# Patient Record
Sex: Male | Born: 1965 | Race: White | Hispanic: No | Marital: Married | Smoking: Never smoker
Health system: Southern US, Community
[De-identification: ages and names within clinical notes are randomized; demographics above are authoritative.]

---

## 2006-10-01 ENCOUNTER — Emergency Department (HOSPITAL_COMMUNITY): Admission: EM | Admit: 2006-10-01 | Discharge: 2006-10-01 | Payer: Self-pay | Admitting: Emergency Medicine

## 2006-10-04 ENCOUNTER — Emergency Department (HOSPITAL_COMMUNITY): Admission: EM | Admit: 2006-10-04 | Discharge: 2006-10-04 | Payer: Self-pay | Admitting: Emergency Medicine

## 2011-05-08 ENCOUNTER — Encounter (HOSPITAL_COMMUNITY): Payer: Self-pay | Admitting: *Deleted

## 2011-05-08 ENCOUNTER — Emergency Department (HOSPITAL_COMMUNITY)
Admission: EM | Admit: 2011-05-08 | Discharge: 2011-05-08 | Disposition: A | Discharge status: Home / Self Care | Payer: Worker's Compensation | Attending: Emergency Medicine | Admitting: Emergency Medicine

## 2011-05-08 ENCOUNTER — Emergency Department (HOSPITAL_COMMUNITY): Payer: Worker's Compensation

## 2011-05-08 DIAGNOSIS — S60229A Contusion of unspecified hand, initial encounter: Secondary | ICD-10-CM | POA: Insufficient documentation

## 2011-05-08 DIAGNOSIS — Y9269 Other specified industrial and construction area as the place of occurrence of the external cause: Secondary | ICD-10-CM | POA: Insufficient documentation

## 2011-05-08 DIAGNOSIS — W319XXA Contact with unspecified machinery, initial encounter: Secondary | ICD-10-CM | POA: Insufficient documentation

## 2011-05-08 DIAGNOSIS — S61209A Unspecified open wound of unspecified finger without damage to nail, initial encounter: Secondary | ICD-10-CM | POA: Insufficient documentation

## 2011-05-08 DIAGNOSIS — S60221A Contusion of right hand, initial encounter: Secondary | ICD-10-CM

## 2011-05-08 DIAGNOSIS — S61219A Laceration without foreign body of unspecified finger without damage to nail, initial encounter: Secondary | ICD-10-CM

## 2011-05-08 MED ORDER — IBUPROFEN 800 MG PO TABS
800.0000 mg | ORAL_TABLET | Freq: Once | ORAL | Status: AC
  Administered 2011-05-08: 800 mg via ORAL
  Filled 2011-05-08: qty 1

## 2011-05-08 MED ORDER — HYDROCODONE-ACETAMINOPHEN 5-325 MG PO TABS
ORAL_TABLET | ORAL | Status: DC
Start: 2011-05-08 — End: 2022-07-17

## 2011-05-08 NOTE — ED Provider Notes (Signed)
History     CSN: [id number]  Arrival date & time 05/08/11  4696   First MD Initiated Contact with Patient 05/08/11 1952      Chief Complaint  Patient presents with  . Hand Injury    (Consider location/radiation/quality/duration/timing/severity/associated sxs/prior treatment) HPI Comments: R hand dominant.  dT UTD.  Patient is a 46 y.o. male presenting with hand injury. The history is provided by the patient. No language interpreter was used.  Hand Injury  The incident occurred 1 to 2 hours ago. The incident occurred at work. Injury mechanism: crush-type trauma to R hand in machine at work. Pain location: R thumb and 3rd finger. The quality of the pain is described as throbbing. The pain is at a severity of 6/10. The pain has been constant since the incident. He reports no foreign bodies present. The symptoms are aggravated by movement and palpation. He has tried nothing for the symptoms.    History reviewed. No pertinent past medical history.  History reviewed. No pertinent past surgical history.  History reviewed. No pertinent family history.  History  Substance Use Topics  . Smoking status: Never Smoker   . Smokeless tobacco: Not on file  . Alcohol Use: No      Review of Systems  Musculoskeletal:       Hand injury  Skin: Positive for wound.  All other systems reviewed and are negative.    Allergies  Review of patient's allergies indicates no known allergies.  Home Medications   Current Outpatient Rx  Name Route Sig Dispense Refill  . HYDROCODONE-ACETAMINOPHEN 5-325 MG PO TABS  One tab po q 4-6 hrs prn pain 20 tablet 0    BP 129/106  Pulse 115  Temp(Src) 98.8 F (37.1 C) (Oral)  Resp 16  Ht 5\' 5"  (1.651 m)  Wt 240 lb (108.863 kg)  BMI 39.94 kg/m2  SpO2 100%  Physical Exam  Nursing note and vitals reviewed. Constitutional: He is oriented to person, place, and time. He appears well-developed and well-nourished.  HENT:  Head: Normocephalic and  atraumatic.  Eyes: EOM are normal.  Neck: Normal range of motion.  Cardiovascular: Normal rate, regular rhythm, normal heart sounds and intact distal pulses.   Pulmonary/Chest: Effort normal and breath sounds normal. No respiratory distress.  Abdominal: Soft. He exhibits no distension. There is no tenderness.  Musculoskeletal: He exhibits tenderness.       Right hand: He exhibits decreased range of motion, tenderness, bony tenderness, laceration and swelling. He exhibits no deformity. normal sensation noted.       Hands: Neurological: He is alert and oriented to person, place, and time.  Skin: Skin is warm and dry.  Psychiatric: He has a normal mood and affect. Judgment normal.    ED Course  Procedures (including critical care time)  Labs Reviewed - No data to display Dg Hand Complete Right  05/08/2011  *RADIOLOGY REPORT*  Clinical Data: Pain and swelling post injury  RIGHT HAND - COMPLETE 3+ VIEW  Comparison: None.  Findings: Three views of the right hand submitted.  No acute fracture or subluxation.  Soft tissue swelling noted especially in palmar aspect.  IMPRESSION: No acute fracture or subluxation.  Soft tissue swelling.  Original Report Authenticated By: Natasha Mead, M.D.     1. Contusion of right hand   2. Laceration of finger       MDM  Ice Thumb spica Ibuprofen rx-hydrocodone, 20 F/u with dr. Hilda Lias.        Jacinda Kanady Judie Petit  [place], [place] 05/08/11 2017  Worthy Rancher, PA 05/08/11 2022

## 2011-05-08 NOTE — ED Notes (Signed)
Right hand got squeezed by a machine at work, works for [employer], Counsellor noted right ring finger, hand had got caught in machinery

## 2011-05-08 NOTE — ED Notes (Signed)
Pt D/C to home with friend.

## 2011-05-08 NOTE — Discharge Instructions (Signed)
Contusion A contusion is a deep bruise. Contusions happen when an injury causes bleeding under the skin. Signs of bruising include pain, puffiness (swelling), and discolored skin. The contusion may turn blue, purple, or yellow. HOME CARE   Put ice on the injured area.   Put ice in a plastic bag.   Place a towel between your skin and the bag.   Leave the ice on for 15 to 20 minutes, 3 to 4 times a day.   Only take medicine as told by your doctor.   Rest the injured area.   If possible, raise (elevate) the injured area to lessen puffiness.  GET HELP RIGHT AWAY IF:   You have more bruising or puffiness.   You have pain that is getting worse.   Your puffiness or pain is not helped by medicine.  MAKE SURE YOU:   Understand these instructions.   Will watch your condition.   Will get help right away if you are not doing well or get worse.  Document Released: 07/18/2007 Document Revised: 01/18/2011 Document Reviewed: 12/04/2010 [facility] Patient Information 2012 [place], [place].Wound Care Wound care helps prevent pain and infection.  You may need a tetanus shot if:  You cannot remember when you had your last tetanus shot.   You have never had a tetanus shot.   The injury broke your skin.  If you need a tetanus shot and you choose not to have one, you may get tetanus. Sickness from tetanus can be serious. HOME CARE   Only take medicine as told by your doctor.   Clean the wound daily with mild soap and water.   Change any bandages (dressings) as told by your doctor.   Put medicated cream and a bandage on the wound as told by your doctor.   Change the bandage if it gets wet, dirty, or starts to smell.   Take showers. Do not take baths, swim, or do anything that puts your wound under water.   Rest and raise (elevate) the wound until the pain and puffiness (swelling) are better.   Keep all doctor visits as told.  GET HELP RIGHT AWAY IF:   Yellowish-white fluid (pus)  comes from the wound.   Medicine does not lessen your pain.   There is a red streak going away from the wound.   You cannot move your finger or toe.   You have a fever.  MAKE SURE YOU:   Understand these instructions.   Will watch your condition.   Will get help right away if you are not doing well or get worse.  Document Released: 11/08/2007 Document Revised: 01/18/2011 Document Reviewed: 06/04/2010 [facility] Patient Information 2012 [place], [place].Cryotherapy Cryotherapy means treatment with cold. Ice or gel packs can be used to reduce both pain and swelling. Ice is the most helpful within the first 24 to 48 hours after an injury or flareup from overusing a muscle or joint. Sprains, strains, spasms, burning pain, shooting pain, and aches can all be eased with ice. Ice can also be used when recovering from surgery. Ice is effective, has very few side effects, and is safe for most people to use. PRECAUTIONS  Ice is not a safe treatment option for people with:  Raynaud's phenomenon. This is a condition affecting small blood vessels in the extremities. Exposure to cold may cause your problems to return.   Cold hypersensitivity. There are many forms of cold hypersensitivity, including:   Cold urticaria. Red, itchy hives appear on the skin  when the tissues begin to warm after being iced.   Cold erythema. This is a red, itchy rash caused by exposure to cold.   Cold hemoglobinuria. Red blood cells break down when the tissues begin to warm after being iced. The hemoglobin that carry oxygen are passed into the urine because they cannot combine with blood proteins fast enough.   Numbness or altered sensitivity in the area being iced.  If you have any of the following conditions, do not use ice until you have discussed cryotherapy with your caregiver:  Heart conditions, such as arrhythmia, angina, or chronic heart disease.   High blood pressure.   Healing wounds or open skin in the area  being iced.   Current infections.   Rheumatoid arthritis.   Poor circulation.   Diabetes.  Ice slows the blood flow in the region it is applied. This is beneficial when trying to stop inflamed tissues from spreading irritating chemicals to surrounding tissues. However, if you expose your skin to cold temperatures for too long or without the proper protection, you can damage your skin or nerves. Watch for signs of skin damage due to cold. HOME CARE INSTRUCTIONS Follow these tips to use ice and cold packs safely.  Place a dry or damp towel between the ice and skin. A damp towel will cool the skin more quickly, so you may need to shorten the time that the ice is used.   For a more rapid response, add gentle compression to the ice.   Ice for no more than 10 to 20 minutes at a time. The bonier the area you are icing, the less time it will take to get the benefits of ice.   Check your skin after 5 minutes to make sure there are no signs of a poor response to cold or skin damage.   Rest 20 minutes or more in between uses.   Once your skin is numb, you can end your treatment. You can test numbness by very lightly touching your skin. The touch should be so light that you do not see the skin dimple from the pressure of your fingertip. When using ice, most people will feel these normal sensations in this order: cold, burning, aching, and numbness.   Do not use ice on someone who cannot communicate their responses to pain, such as small children or people with dementia.  HOW TO MAKE AN ICE PACK Ice packs are the most common way to use ice therapy. Other methods include ice massage, ice baths, and cryo-sprays. Muscle creams that cause a cold, tingly feeling do not offer the same benefits that ice offers and should not be used as a substitute unless recommended by your caregiver. To make an ice pack, do one of the following:  Place crushed ice or a bag of frozen vegetables in a sealable plastic bag.  Squeeze out the excess air. Place this bag inside another plastic bag. Slide the bag into a pillowcase or place a damp towel between your skin and the bag.   Mix 3 parts water with 1 part rubbing alcohol. Freeze the mixture in a sealable plastic bag. When you remove the mixture from the freezer, it will be slushy. Squeeze out the excess air. Place this bag inside another plastic bag. Slide the bag into a pillowcase or place a damp towel between your skin and the bag.  SEEK MEDICAL CARE IF:  You develop white spots on your skin. This may give the skin a blotchy (  mottled) appearance.   Your skin turns blue or pale.   Your skin becomes waxy or hard.   Your swelling gets worse.  MAKE SURE YOU:   Understand these instructions.   Will watch your condition.   Will get help right away if you are not doing well or get worse.  Document Released: 09/25/2010 Document Revised: 01/18/2011 Document Reviewed: 09/25/2010 [facility] Patient Information 2012 [place], [place].   Wear the splint for comfort and stability.  Apply ice several times daily and elevate hand as much as possible.  Wash the wound twice daily with soap and water then apply antibiotic ointment.  Take ibuprofen up to 800 mg every 8 hrs with food.  Take the pain medicine as directed.  Call dr. Hilda Lias and make an appt for further evaluation.

## 2011-05-12 NOTE — ED Provider Notes (Signed)
Medical screening examination/treatment/procedure(s) were performed by non-physician practitioner and as supervising physician I was immediately available for consultation/collaboration.  Storm Dulski L Emanuele Mcwhirter, MD 05/12/11 0938 

## 2013-08-12 IMAGING — CR DG HAND COMPLETE 3+V*R*
3 series · 3 of 3 positions shown · non-contrast
Comparison: None.

CLINICAL DATA: Pain and swelling post injury

RIGHT HAND - COMPLETE 3+ VIEW

[view not recorded (1 of 3)]
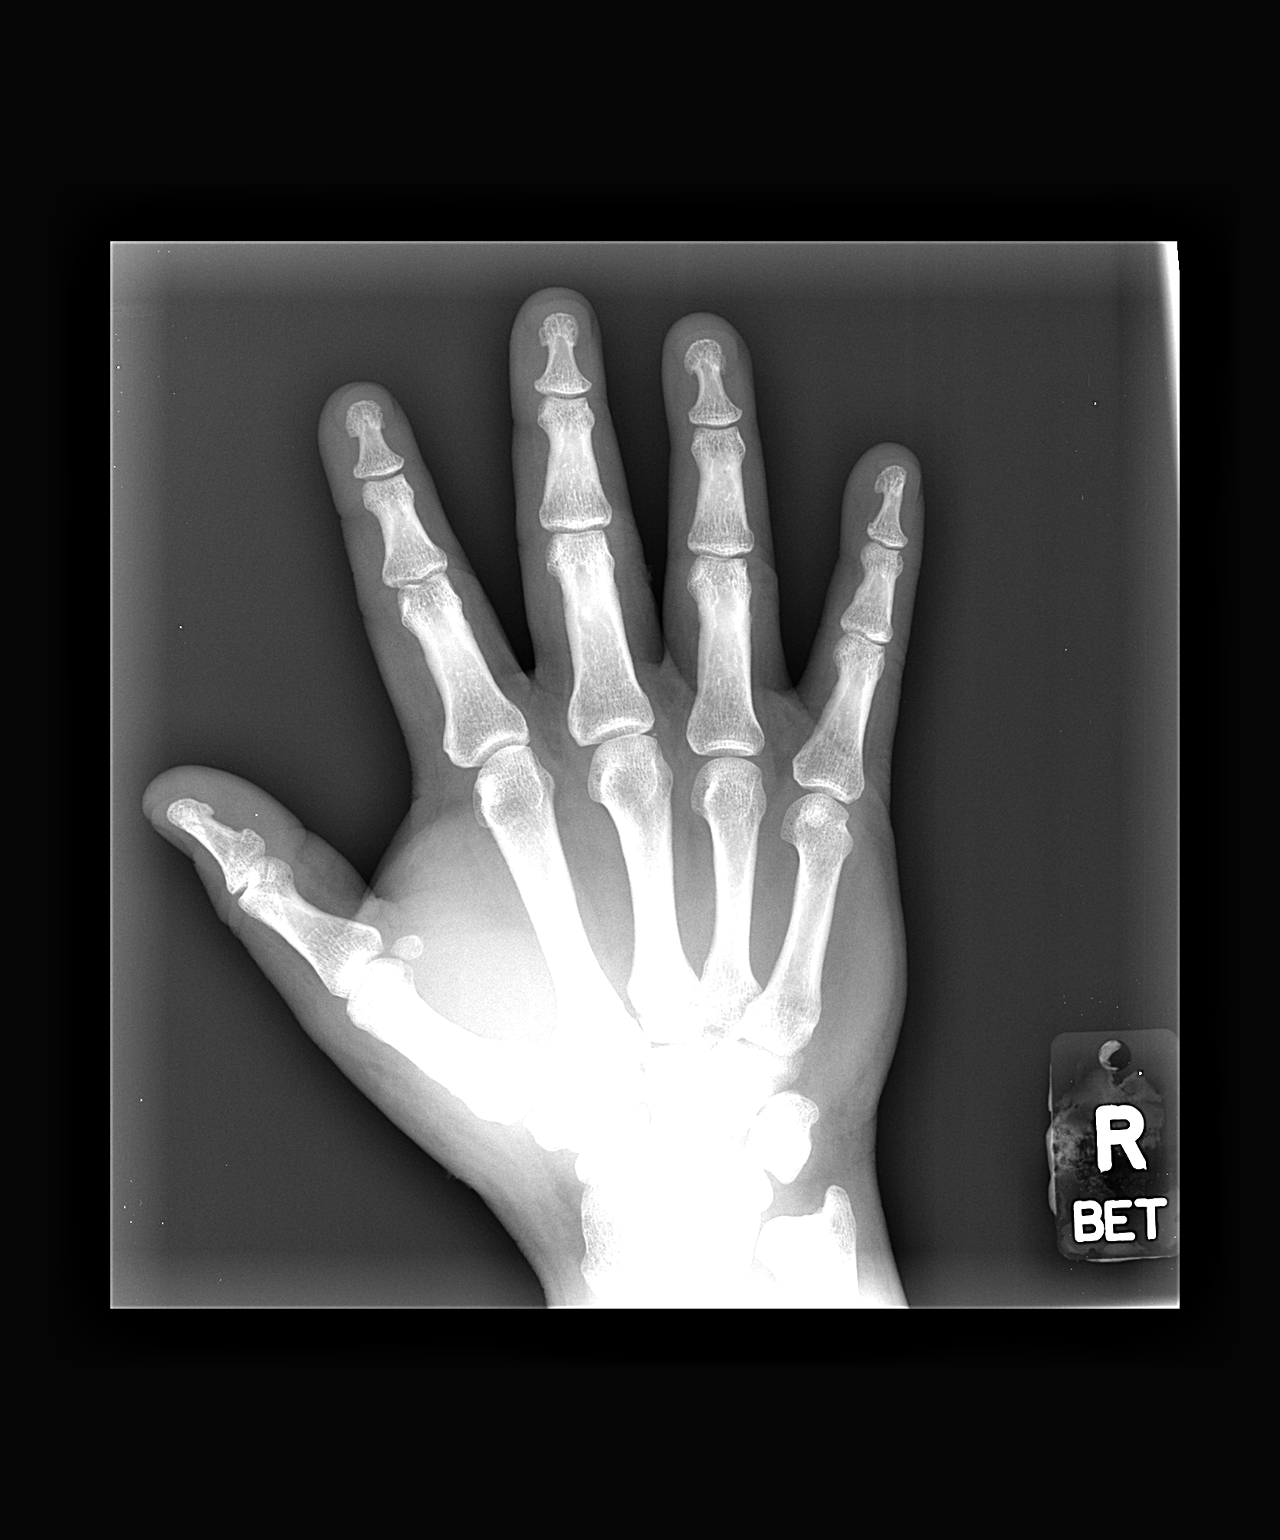

[view not recorded (2 of 3)]
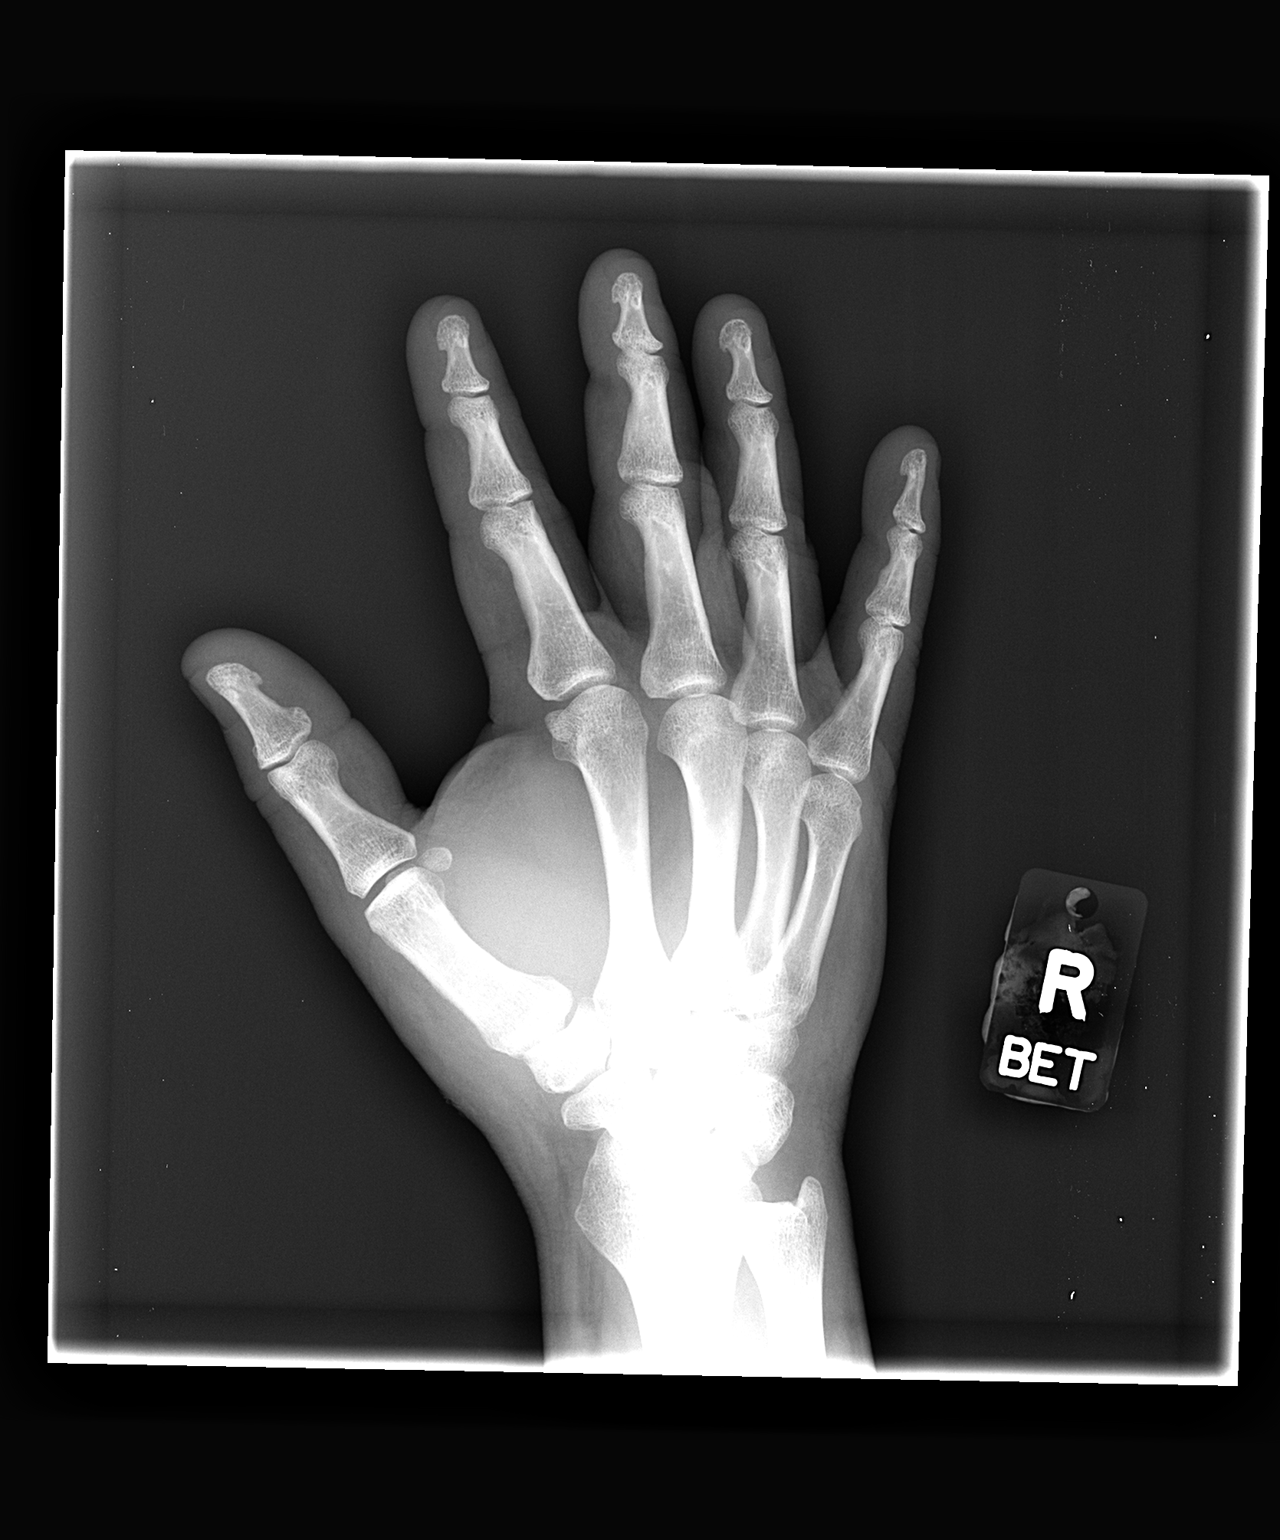

[view not recorded (3 of 3)]
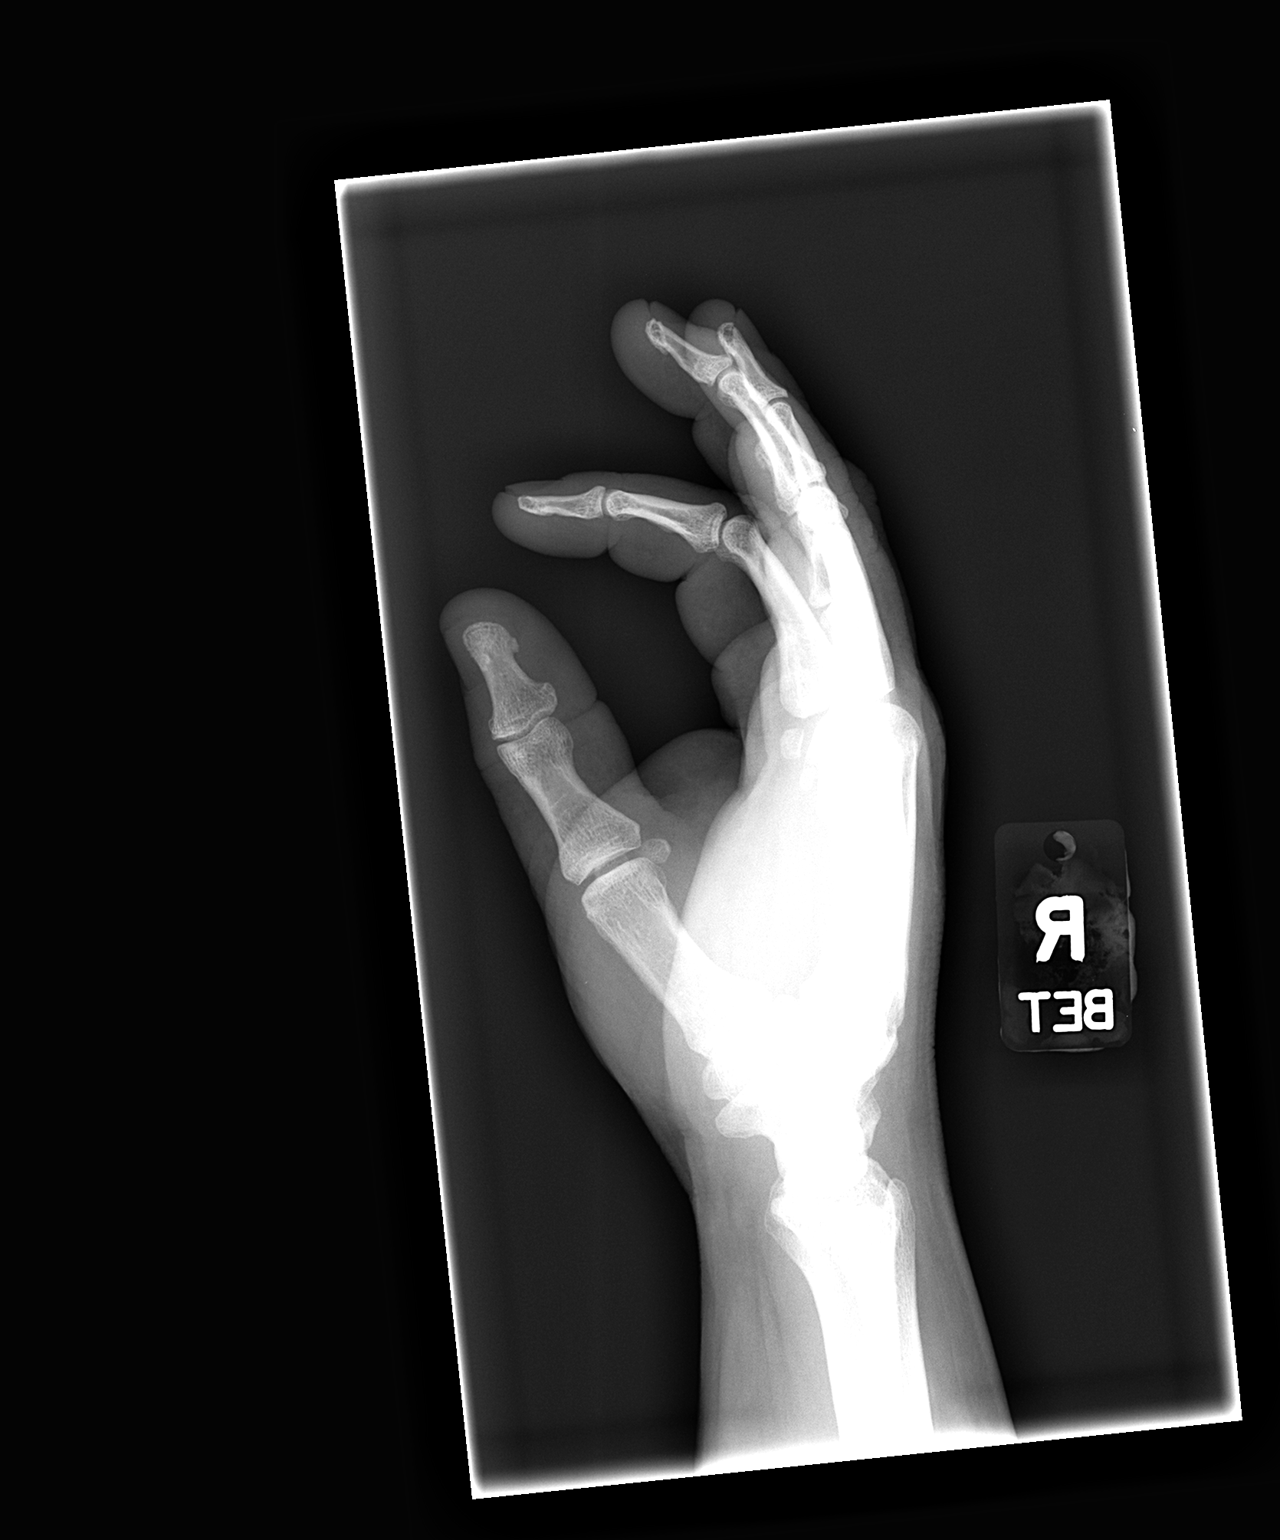

[3 of 3 positions shown; findings below may reference images not displayed]

FINDINGS: Three views of the right hand submitted.  No acute
fracture or subluxation.  Soft tissue swelling noted especially in
palmar aspect.
IMPRESSION: No acute fracture or subluxation.  Soft tissue swelling.

## 2022-07-15 ENCOUNTER — Emergency Department (HOSPITAL_COMMUNITY)
Admission: EM | Admit: 2022-07-15 | Discharge: 2022-07-15 | Disposition: A | Discharge status: Home / Self Care | Payer: BC Managed Care – PPO | Attending: Emergency Medicine | Admitting: Emergency Medicine

## 2022-07-15 ENCOUNTER — Emergency Department (HOSPITAL_COMMUNITY): Payer: BC Managed Care – PPO

## 2022-07-15 ENCOUNTER — Other Ambulatory Visit: Payer: Self-pay

## 2022-07-15 ENCOUNTER — Encounter (HOSPITAL_COMMUNITY): Payer: Self-pay | Admitting: Emergency Medicine

## 2022-07-15 DIAGNOSIS — K5792 Diverticulitis of intestine, part unspecified, without perforation or abscess without bleeding: Secondary | ICD-10-CM

## 2022-07-15 DIAGNOSIS — K5732 Diverticulitis of large intestine without perforation or abscess without bleeding: Secondary | ICD-10-CM | POA: Insufficient documentation

## 2022-07-15 DIAGNOSIS — R1032 Left lower quadrant pain: Secondary | ICD-10-CM

## 2022-07-15 DIAGNOSIS — K5900 Constipation, unspecified: Secondary | ICD-10-CM | POA: Insufficient documentation

## 2022-07-15 LAB — URINALYSIS, ROUTINE W REFLEX MICROSCOPIC
Bilirubin Urine: NEGATIVE
Glucose, UA: NEGATIVE mg/dL
Hgb urine dipstick: NEGATIVE
Ketones, ur: NEGATIVE mg/dL
Leukocytes,Ua: NEGATIVE
Nitrite: NEGATIVE
Protein, ur: NEGATIVE mg/dL
Specific Gravity, Urine: 1.017 (ref 1.005–1.030)
pH: 7 (ref 5.0–8.0)

## 2022-07-15 LAB — COMPREHENSIVE METABOLIC PANEL
ALT: 29 U/L (ref 0–44)
AST: 25 U/L (ref 15–41)
Albumin: 4 g/dL (ref 3.5–5.0)
Alkaline Phosphatase: 60 U/L (ref 38–126)
Anion gap: 7 (ref 5–15)
BUN: 29 mg/dL — ABNORMAL HIGH (ref 6–20)
CO2: 24 mmol/L (ref 22–32)
Calcium: 8.4 mg/dL — ABNORMAL LOW (ref 8.9–10.3)
Chloride: 103 mmol/L (ref 98–111)
Creatinine, Ser: 1.38 mg/dL — ABNORMAL HIGH (ref 0.61–1.24)
GFR, Estimated: >60 mL/min (ref >60)
Glucose, Bld: 104 mg/dL — ABNORMAL HIGH (ref 70–99)
Potassium: 4.1 mmol/L (ref 3.5–5.1)
Sodium: 134 mmol/L — ABNORMAL LOW (ref 135–145)
Total Bilirubin: 0.9 mg/dL (ref 0.3–1.2)
Total Protein: 7 g/dL (ref 6.5–8.1)

## 2022-07-15 LAB — CBC
HCT: 41.3 % (ref 39.0–52.0)
Hemoglobin: 14.2 g/dL (ref 13.0–17.0)
MCH: 29.9 pg (ref 26.0–34.0)
MCHC: 34.4 g/dL (ref 30.0–36.0)
MCV: 86.9 fL (ref 80.0–100.0)
Platelets: 217 10*3/uL (ref 150–400)
RBC: 4.75 MIL/uL (ref 4.22–5.81)
RDW: 12.7 % (ref 11.5–15.5)
WBC: 7.1 10*3/uL (ref 4.0–10.5)
nRBC: 0 % (ref 0.0–0.2)

## 2022-07-15 LAB — LIPASE, BLOOD: Lipase: 42 U/L (ref 11–51)

## 2022-07-15 MED ORDER — AMOXICILLIN-POT CLAVULANATE 875-125 MG PO TABS
1.0000 | ORAL_TABLET | Freq: Once | ORAL | Status: AC
  Administered 2022-07-15: 1 via ORAL
  Filled 2022-07-15: qty 1

## 2022-07-15 MED ORDER — AMOXICILLIN-POT CLAVULANATE 875-125 MG PO TABS: 1.0000 | ORAL_TABLET | Freq: Two times a day (BID) | ORAL | 0 refills | Status: AC

## 2022-07-15 NOTE — Discharge Instructions (Addendum)
You are seen in the emergency room for abdominal pain, constipation. As discussed, x-ray showing signs of constipation.  Lab workup is normal.  We would like to get a CT scan to get clarity, but the CT is not available.  We did give you the option of getting CT done at [facility], but you are comfortable with the wait and watch approach.  Start taking the antibiotics that are prescribed as we suspect that you might have diverticulitis. Take mag citrate tonight before you go to bed. Ensure you are hydrating well. Please get a primary care doctor and contact the GI doctor to get colonoscopy done -as sometimes constipation could present because of a colonic mass or tumor.  Return to the emergency room immediately if you start having worsening abdominal pain, fevers, chills, sweats or you stop passing gas and have severe nausea and vomiting.

## 2022-07-15 NOTE — ED Provider Notes (Signed)
[place] EMERGENCY DEPARTMENT AT [facility] Provider Note   CSN: [id number] Arrival date & time: 07/15/22  1532     History  Chief Complaint  Patient presents with   Abdominal Pain   Constipation    Jeffery Lopez is a 57 y.o. male.  HPI    57 year old male comes in with chief complaint of constipation, lower quadrant abdominal pain. Patient has no medical history, however he does not see a primary care doctor and does not have a primary care doctor.  Patient is complaining of left lower quadrant abdominal and back pain that started on Wednesday, 3 days ago.  2 days ago he was feeling well, however yesterday the pain started returning and today it is worse.  He did go to urgent care yesterday and they informed him that he likely has constipation.  They gave him stool softener and mag citrate and informed him to come to the ER if he does not get better.  Patient has a history of kidney stone in the past, this pain feels different.  He denies any urinary symptoms such as burning with urination, urinary frequency or blood in the urine.  Patient did have a bowel movement today, but it was not normal amount.  He is currently passing flatus.  He has no abdominal surgical history.  He has taken over-the-counter medications.  Patient states that occasionally the pain will come and is severe.  Review of system is negative for any nausea, vomiting, fevers, chills, bloody stools.  Patient states that over the last 6 months in order to lose weight he has been on heavy salad diet.  He has never had a colonoscopy.  Home Medications Prior to Admission medications   Medication Sig Start Date End Date Taking? Authorizing Provider  amoxicillin-clavulanate (AUGMENTIN) 875-125 MG tablet Take 1 tablet by mouth every 12 (twelve) hours. 07/15/22  Yes Weston Kallman, MD  docusate sodium (COLACE) 100 MG capsule Take 100 mg by mouth 2 (two) times daily. For 3 day 07/14/22  Yes [provider]  ibuprofen (ADVIL) 200 MG tablet Take 400 mg by mouth every 6 (six) hours as needed for moderate pain or headache.   Yes [provider]  magnesium citrate SOLN Take 1 Bottle by mouth once.   Yes [provider]  HYDROcodone-acetaminophen (NORCO) 5-325 MG per tablet One tab po q 4-6 hrs prn pain Patient not taking: Reported on 07/15/2022 05/08/11   Worthy Rancher, PA-C      Allergies    Patient has no known allergies.    Review of Systems   Review of Systems  All other systems reviewed and are negative.   Physical Exam Updated Vital Signs BP (!) 130/90 (BP Location: Left Arm)   Pulse 67   Temp 98.5 F (36.9 C) (Oral)   Resp 20   Ht 5\' 10"  (1.778 m)   Wt 103.9 kg   SpO2 97%   BMI 32.86 kg/m  Physical Exam Vitals and nursing note reviewed.  Constitutional:      Appearance: He is well-developed.  HENT:     Head: Atraumatic.  Cardiovascular:     Rate and Rhythm: Normal rate.  Pulmonary:     Effort: Pulmonary effort is normal.  Abdominal:     Tenderness: There is abdominal tenderness in the left lower quadrant. There is no right CVA tenderness, left CVA tenderness, guarding or rebound.  Musculoskeletal:     Cervical back: Neck supple.  Skin:  General: Skin is warm.  Neurological:     Mental Status: He is alert and oriented to person, place, and time.     ED Results / Procedures / Treatments   Labs (all labs ordered are listed, but only abnormal results are displayed) Labs Reviewed  COMPREHENSIVE METABOLIC PANEL - Abnormal; Notable for the following components:      Result Value   Sodium 134 (*)    Glucose, Bld 104 (*)    BUN 29 (*)    Creatinine, Ser 1.38 (*)    Calcium 8.4 (*)    All other components within normal limits  URINALYSIS, ROUTINE W REFLEX MICROSCOPIC - Abnormal; Notable for the following components:   APPearance HAZY (*)    All other components within normal limits  LIPASE, BLOOD  CBC    EKG None  Radiology DG  Abdomen Acute W/Chest  Result Date: 07/15/2022 CLINICAL DATA:  Possible bowel obstruction EXAM: DG ABDOMEN ACUTE WITH 1 VIEW CHEST COMPARISON:  None Available. FINDINGS: Bowel gas pattern is nonspecific. There is no definite pneumoperitoneum. No abnormal masses or calcifications are seen. Lung fields are clear of any infiltrates. There is no pleural effusion or pneumothorax. IMPRESSION: Nonspecific bowel gas pattern. There are no focal pulmonary infiltrates. Electronically Signed   By: Ernie Avena M.D.   On: 07/15/2022 18:02    Procedures Procedures    Medications Ordered in ED Medications  amoxicillin-clavulanate (AUGMENTIN) 875-125 MG per tablet 1 tablet (1 tablet Oral Given 07/15/22 1922)    ED Course/ Medical Decision Making/ A&P                             Medical Decision Making Amount and/or Complexity of Data Reviewed Labs: ordered. Radiology: ordered.  Risk Prescription drug management.   This patient presents to the ED with chief complaint(s) of abdominal pain with pertinent past medical history of no PCP, no colonoscopy and urgent care visit yesterday where he was started on stool softeners and laxative.The complaint involves an extensive differential diagnosis and also carries with it a high risk of complications and morbidity.    The differential diagnosis includes : Acute diverticulitis without complication, ileus, small bowel obstruction, severe constipation, kidney stones, colonic mass/tumor.   Patient's exam is overall reassuring.  No evidence of peritonitis.  He has no other systemic symptoms either which is reassuring.   The initial plan is to get basic labs, urine analysis and acute abdominal series.  Since he has never had a diagnosis of diverticulosis and there is no imaging or colonoscopy that can inform me that patient has known diverticulosis -I am a bit hesitant in giving patient the diagnosis of diverticulitis without getting a CT scan.  However there  is no CT scan available today.  I discussed with the patient that ideal best neck step is getting a CT scan, however it is not available today.  I informed him that this pain could be diverticulitis, that exam is not consistent with surgical pathology or complication from diverticulitis -but in this case my best neck step is getting a CT scan.  I discussed with him that we can transfer him to [facility] or his wife can drive him to [facility] and get a CT scan today, and we can facilitate that.  Patient however is comfortable going home, and returning to the ER if his symptoms get worse.  I will reassess him after the acute abdominal series.  Reassessment: I have independently interpreted acute abdominal series.  There is evidence of obstruction, but there are no air-fluid levels.  No clinical concerns for small bowel obstruction.  Patient's repeat abdominal exam is reassuring.  He indicates that he has not had any severe pain while awaiting results.  We had a repeat conversation about CT scan.  We offered him CT again, but he feels comfortable going home and will return to the ER if he is getting worse. I discussed with him that if he decides to return to the ER on his own without calling ambulance, then he should consider going to [facility] or [facility] in [place].  If his symptoms are getting worse tomorrow, he can call the ER ahead of time to see if the CT is functional.   Final Clinical Impression(s) / ED Diagnoses Final diagnoses:  LLQ abdominal pain  Diverticulitis  Constipation, unspecified constipation type    Rx / DC Orders ED Discharge Orders          Ordered    amoxicillin-clavulanate (AUGMENTIN) 875-125 MG tablet  Every 12 hours        07/15/22 1953              Derwood Kaplan, MD 07/15/22 2005

## 2022-07-15 NOTE — ED Triage Notes (Addendum)
Pt c/o severe left flank and LLQ abdominal pain since Wednesday, seen yesterday at [facility] and dx severe constipation. Pt reports dx SBO but paperwork does not indicate same. Severe 8/10 pain despite OTC meds, LBM was this morning but was not normal per pt

## 2022-07-15 NOTE — ED Notes (Signed)
Pt states he is unable to urinate at this time.

## 2022-07-17 ENCOUNTER — Other Ambulatory Visit: Payer: Self-pay

## 2022-07-17 ENCOUNTER — Emergency Department (HOSPITAL_COMMUNITY)
Admission: EM | Admit: 2022-07-17 | Discharge: 2022-07-17 | Disposition: A | Discharge status: Home / Self Care | Payer: BC Managed Care – PPO | Attending: Emergency Medicine | Admitting: Emergency Medicine

## 2022-07-17 ENCOUNTER — Emergency Department (HOSPITAL_COMMUNITY): Payer: BC Managed Care – PPO

## 2022-07-17 ENCOUNTER — Encounter (HOSPITAL_COMMUNITY): Payer: Self-pay | Admitting: *Deleted

## 2022-07-17 DIAGNOSIS — R1032 Left lower quadrant pain: Secondary | ICD-10-CM | POA: Diagnosis present

## 2022-07-17 DIAGNOSIS — N200 Calculus of kidney: Secondary | ICD-10-CM | POA: Diagnosis not present

## 2022-07-17 LAB — COMPREHENSIVE METABOLIC PANEL
ALT: 28 U/L (ref 0–44)
AST: 21 U/L (ref 15–41)
Albumin: 3.8 g/dL (ref 3.5–5.0)
Alkaline Phosphatase: 66 U/L (ref 38–126)
Anion gap: 8 (ref 5–15)
BUN: 23 mg/dL — ABNORMAL HIGH (ref 6–20)
CO2: 26 mmol/L (ref 22–32)
Calcium: 8.5 mg/dL — ABNORMAL LOW (ref 8.9–10.3)
Chloride: 98 mmol/L (ref 98–111)
Creatinine, Ser: 1.54 mg/dL — ABNORMAL HIGH (ref 0.61–1.24)
GFR, Estimated: 53 mL/min — ABNORMAL LOW (ref >60)
Glucose, Bld: 108 mg/dL — ABNORMAL HIGH (ref 70–99)
Potassium: 4.4 mmol/L (ref 3.5–5.1)
Sodium: 132 mmol/L — ABNORMAL LOW (ref 135–145)
Total Bilirubin: 0.9 mg/dL (ref 0.3–1.2)
Total Protein: 6.8 g/dL (ref 6.5–8.1)

## 2022-07-17 LAB — CBC WITH DIFFERENTIAL/PLATELET
Abs Immature Granulocytes: 0.02 10*3/uL (ref 0.00–0.07)
Basophils Absolute: 0 10*3/uL (ref 0.0–0.1)
Basophils Relative: 0 %
Eosinophils Absolute: 0.1 10*3/uL (ref 0.0–0.5)
Eosinophils Relative: 1 %
HCT: 41.3 % (ref 39.0–52.0)
Hemoglobin: 14.4 g/dL (ref 13.0–17.0)
Immature Granulocytes: 0 %
Lymphocytes Relative: 11 %
Lymphs Abs: 0.9 10*3/uL (ref 0.7–4.0)
MCH: 29.9 pg (ref 26.0–34.0)
MCHC: 34.9 g/dL (ref 30.0–36.0)
MCV: 85.9 fL (ref 80.0–100.0)
Monocytes Absolute: 1 10*3/uL (ref 0.1–1.0)
Monocytes Relative: 12 %
Neutro Abs: 6.3 10*3/uL (ref 1.7–7.7)
Neutrophils Relative %: 76 %
Platelets: 228 10*3/uL (ref 150–400)
RBC: 4.81 MIL/uL (ref 4.22–5.81)
RDW: 12.4 % (ref 11.5–15.5)
WBC: 8.4 10*3/uL (ref 4.0–10.5)
nRBC: 0 % (ref 0.0–0.2)

## 2022-07-17 LAB — LIPASE, BLOOD: Lipase: 32 U/L (ref 11–51)

## 2022-07-17 MED ORDER — IOHEXOL 300 MG/ML  SOLN
100.0000 mL | Freq: Once | INTRAMUSCULAR | Status: AC | PRN
  Administered 2022-07-17: 100 mL via INTRAVENOUS

## 2022-07-17 MED ORDER — HYDROCODONE-ACETAMINOPHEN 5-325 MG PO TABS: 1.0000 | ORAL_TABLET | Freq: Four times a day (QID) | ORAL | 0 refills | Status: AC | PRN

## 2022-07-17 NOTE — ED Triage Notes (Addendum)
Pt with abd pain, seen for constipation on Sunday.  Had some results yesterday after taking  otc magnesium citrate, enemas and dulcolax suppositories.

## 2022-07-17 NOTE — Discharge Instructions (Addendum)
You do have a 3 mm kidney stone on your left side which is trying to pass.  Make sure you are drinking plenty of fluids.  You may take the pain medication prescribed if needed for pain relief, however do not drive within 4 hours of taking this as it will make you drowsy.  Get rechecked immediately if you develop fevers, worse pain, nausea or vomiting.  Strain your urine so you will know if this stone passes.  Call Dr. Retta Diones for an office follow-up as discussed.

## 2022-07-19 NOTE — ED Provider Notes (Signed)
[place] EMERGENCY DEPARTMENT AT [facility] Provider Note   CSN: [id number] Arrival date & time: 07/17/22  1051     History  Chief Complaint  Patient presents with   Constipation    Jeffery Lopez is a 57 y.o. male with no known past medical history although does endorse having a distant history of kidney stone presenting for evaluation of left lower abdominal pain.  He was seen here 2 days ago at which time it was felt he had constipation as the primary diagnosis as he had been having some constipation difficulties.  At the time of that visit he had already treated himself with a stool softener and magnesium citrate and had a small stool but continued to have this left for quadrant pain.  At the time of that visit CT imaging was not available, he was offered transportation to [facility] to have that completed, patient deferred but was advised to return if his symptoms are not better after continuing to work on the constipation.  He returns as he has not had significant improvement in his symptoms although his constipation has now turned to diarrhea.  He describes 3 episodes of very soft but not liquid stools since yesterday.  He describes sometimes sharp sometimes aching quality pain, it is intermittent.  He has had no nausea or vomiting, denies fevers or chills.  No dysuria, denies flank pain.  He states his pain is not similar to the kidney stone episode although he states it was so long ago he is not sure he remembers the exact symptoms.  The history is provided by the patient.       Home Medications Prior to Admission medications   Medication Sig Start Date End Date Taking? Authorizing Provider  HYDROcodone-acetaminophen (NORCO/VICODIN) 5-325 MG tablet Take 1 tablet by mouth every 6 (six) hours as needed for severe pain. 07/17/22  Yes Blimie Vaness, Raynelle Fanning, PA-C  amoxicillin-clavulanate (AUGMENTIN) 875-125 MG tablet Take 1 tablet by mouth every 12 (twelve) hours. 07/15/22   Derwood Kaplan,  MD  docusate sodium (COLACE) 100 MG capsule Take 100 mg by mouth 2 (two) times daily. For 3 day 07/14/22   [provider]  ibuprofen (ADVIL) 200 MG tablet Take 400 mg by mouth every 6 (six) hours as needed for moderate pain or headache.    [provider]  magnesium citrate SOLN Take 1 Bottle by mouth once.    [provider]      Allergies    Patient has no known allergies.    Review of Systems   Review of Systems  Constitutional:  Negative for fever.  HENT: Negative.    Eyes: Negative.   Respiratory:  Negative for chest tightness and shortness of breath.   Cardiovascular:  Negative for chest pain.  Gastrointestinal:  Positive for abdominal pain and constipation. Negative for nausea and vomiting.  Genitourinary: Negative.   Musculoskeletal:  Negative for arthralgias, joint swelling and neck pain.  Skin: Negative.  Negative for rash and wound.  Neurological:  Negative for dizziness, weakness, light-headedness, numbness and headaches.  Psychiatric/Behavioral: Negative.    All other systems reviewed and are negative.   Physical Exam Updated Vital Signs BP 136/84   Pulse 75   Temp 98.9 F (37.2 C)   Resp 16   Ht 5\' 9"  (1.753 m)   Wt 103.9 kg   SpO2 98%   BMI 33.82 kg/m  Physical Exam Vitals and nursing note reviewed.  Constitutional:      Appearance:  He is well-developed.  HENT:     Head: Normocephalic and atraumatic.  Eyes:     Conjunctiva/sclera: Conjunctivae normal.  Cardiovascular:     Rate and Rhythm: Normal rate and regular rhythm.     Heart sounds: Normal heart sounds.  Pulmonary:     Effort: Pulmonary effort is normal.     Breath sounds: Normal breath sounds. No wheezing.  Abdominal:     General: Bowel sounds are normal.     Palpations: Abdomen is soft.     Tenderness: There is abdominal tenderness in the left lower quadrant. There is no right CVA tenderness, left CVA tenderness, guarding or rebound.  Musculoskeletal:         General: Normal range of motion.     Cervical back: Normal range of motion.  Skin:    General: Skin is warm and dry.  Neurological:     Mental Status: He is alert.     ED Results / Procedures / Treatments   Labs (all labs ordered are listed, but only abnormal results are displayed) Labs Reviewed  COMPREHENSIVE METABOLIC PANEL - Abnormal; Notable for the following components:      Result Value   Sodium 132 (*)    Glucose, Bld 108 (*)    BUN 23 (*)    Creatinine, Ser 1.54 (*)    Calcium 8.5 (*)    GFR, Estimated 53 (*)    All other components within normal limits  CBC WITH DIFFERENTIAL/PLATELET  LIPASE, BLOOD    EKG None  Radiology No results found.  CT ABDOMEN PELVIS W CONTRAST  Result Date: 07/17/2022 CLINICAL DATA:  LLQ abdominal pain new constipation, No oral contrast. EXAM: CT ABDOMEN AND PELVIS WITH CONTRAST TECHNIQUE: Multidetector CT imaging of the abdomen and pelvis was performed using the standard protocol following bolus administration of intravenous contrast. RADIATION DOSE REDUCTION: This exam was performed according to the departmental dose-optimization program which includes automated exposure control, adjustment of the mA and/or kV according to patient size and/or use of iterative reconstruction technique. CONTRAST:  OMNIPAQUE IOHEXOL 300 MG/ML  SOLN COMPARISON:  None Available. FINDINGS: Lower chest: No acute abnormality.  Coronary atherosclerosis. Hepatobiliary: No focal liver abnormality. Cholelithiasis without evidence of acute cholecystitis. No biliary dilatation. Pancreas: Unremarkable. No pancreatic ductal dilatation or surrounding inflammatory changes. Spleen: Normal. Adrenals/Urinary Tract: The adrenal glands are unremarkable. 3 mm stone at the left ureterovesicular junction (image 76 series 2) with associated mild left hydroureteronephrosis and delayed enhancement of the left kidney, consistent with obstructive uropathy. Normal right kidney. Bladder is  unremarkable for degree of distention. Stomach/Bowel: Normal stomach and duodenum. No dilated loops of small bowel. Normal appendix is visualized on axial image 54 series 2. Fluid-filled colon. Vascular/Lymphatic: Aortic atherosclerosis. No enlarged abdominal or pelvic lymph nodes. Reproductive: Prostate is unremarkable. Other: No abdominal wall hernia or abnormality. No abdominopelvic ascites. Musculoskeletal: No acute or significant osseous findings. IMPRESSION: 1. 3 mm stone at the left ureterovesicular junction with associated mild left hydroureteronephrosis and delayed enhancement of the left kidney, consistent with obstructive uropathy. 2. Fluid-filled colon, which can be seen in the setting of diarrheal illness. 3. Cholelithiasis without evidence of acute cholecystitis. 4. Coronary atherosclerosis. Aortic Atherosclerosis (ICD10-I70.0). Electronically Signed   By: Orvan Falconer M.D.   On: 07/17/2022 13:55   DG Abdomen Acute W/Chest  Result Date: 07/15/2022 CLINICAL DATA:  Possible bowel obstruction EXAM: DG ABDOMEN ACUTE WITH 1 VIEW CHEST COMPARISON:  None Available. FINDINGS: Bowel gas pattern is nonspecific. There is  no definite pneumoperitoneum. No abnormal masses or calcifications are seen. Lung fields are clear of any infiltrates. There is no pleural effusion or pneumothorax. IMPRESSION: Nonspecific bowel gas pattern. There are no focal pulmonary infiltrates. Electronically Signed   By: Ernie Avena M.D.   On: 07/15/2022 18:02    Procedures Procedures    Medications Ordered in ED Medications  iohexol (OMNIPAQUE) 300 MG/ML solution 100 mL (100 mLs Intravenous Contrast Given 07/17/22 1326)    ED Course/ Medical Decision Making/ A&P                             Medical Decision Making Patient presenting initially with constipation which she has been treating aggressively with Dulcolax, mag citrate, now having diarrhea but still has left lower quadrant pain.  Concerning for  possibility of diverticulitis, he does not have this history.  Does have a history of kidney stones, but denies flank pain or urinary complaints.  Could also represent a bowel obstruction but is acute abdominal series from 2 days ago does not reflect this process.  CT imaging was completed, he has a 3 mm left ureteral stone which explains the source of his left sided pain, is interesting that he never experienced back or flank pain with this.  He does not have diverticulitis.  Fluid-filled colon, secondary to aggressive treatment of constipation.  Reassuringly he did not have any diarrhea wall he was here.  Advised he could take Imodium if needed for worsening diarrhea, but it sounds like this may be self-limiting and improving at this time.  He was given a urine strainer and pain medication, given strict return precautions for any development of fever, worse pain or nausea or vomiting.  Referral to given to urology for follow-up care.  Encouraged increase fluid intake.  Amount and/or Complexity of Data Reviewed Labs: ordered.    Details: Normal WBC count at 8.4, his hemoglobin is 14.4, normal lipase at 32, his sodium is a little reduced at 132, his BUN is 23, creatinine is 1.54, this is comparable with prior labs. Radiology: ordered.  Risk Prescription drug management.           Final Clinical Impression(s) / ED Diagnoses Final diagnoses:  Kidney stone    Rx / DC Orders ED Discharge Orders          Ordered    HYDROcodone-acetaminophen (NORCO/VICODIN) 5-325 MG tablet  Every 6 hours PRN        07/17/22 1543              Burgess Amor, PA-C 07/19/22 1529    Bethann Berkshire, MD 07/24/22 1057
